# Patient Record
Sex: Male | Born: 1990 | Race: White | Hispanic: No | Marital: Single | Smoking: Current every day smoker
Health system: Southern US, Community
[De-identification: ages and names within clinical notes are randomized; demographics above are authoritative.]

## PROBLEM LIST (undated history)

## (undated) HISTORY — PX: APPENDECTOMY: SHX54

---
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
## 2010-01-27 ENCOUNTER — Emergency Department: Payer: Self-pay | Admitting: Emergency Medicine

## 2010-01-28 ENCOUNTER — Inpatient Hospital Stay: Payer: Self-pay | Admitting: Surgery

## 2010-02-02 LAB — PATHOLOGY REPORT

## 2011-06-06 IMAGING — CT CT STONE STUDY
1 of 2 series · 15 of 32 positions shown, 19 images · non-contrast
Comparison: none

REASON FOR EXAM: right flank pain radiating to RLQ, dysuria
COMMENTS:

PROCEDURE:     CT  - CT ABDOMEN /PELVIS WO (STONE)  - January 27, 2010  [DATE]
RESULT:     CT abdomen and pelvis dated 01/10/2010.
TECHNIQUE: Helical noncontrasted 3 mm sections were obtained from the lung
bases through the pubic symphysis.

[Series 2: stone · axial · 0.64mm/px · z∈[-1120,-732]mm · 15 of 141 slices shown, 19 images]
[im 6/141  soft-tissue]
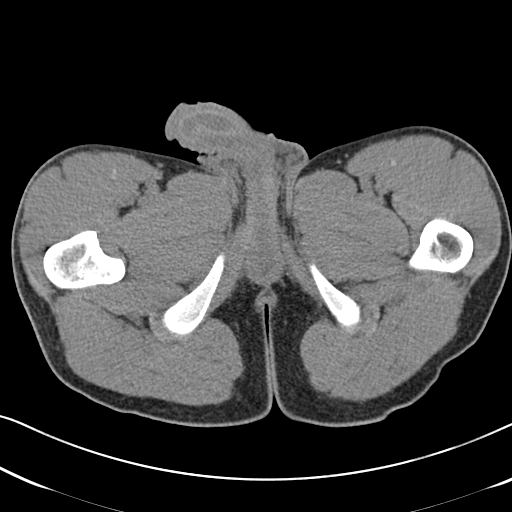
[im 6/141  bone]
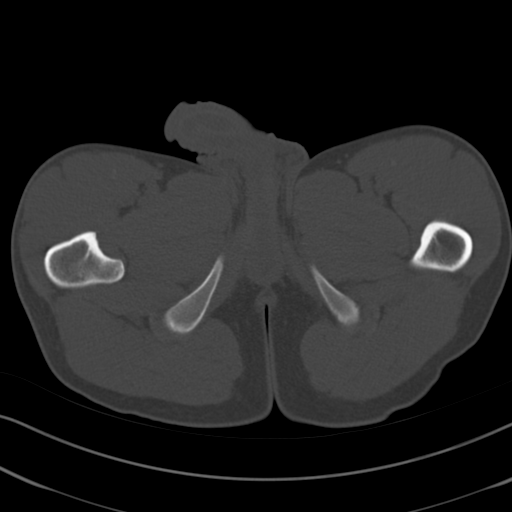
[im 18/141  soft-tissue]
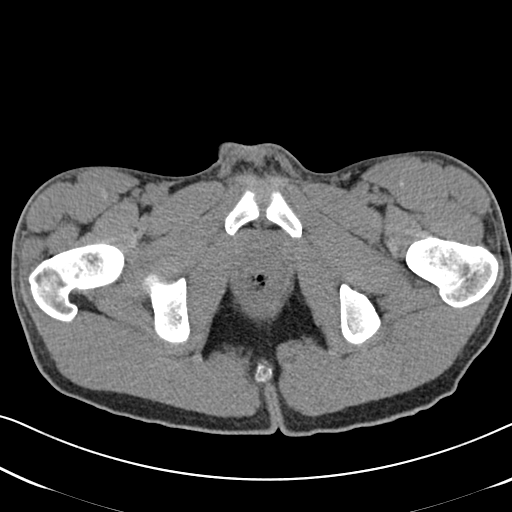
[im 30/141  soft-tissue]
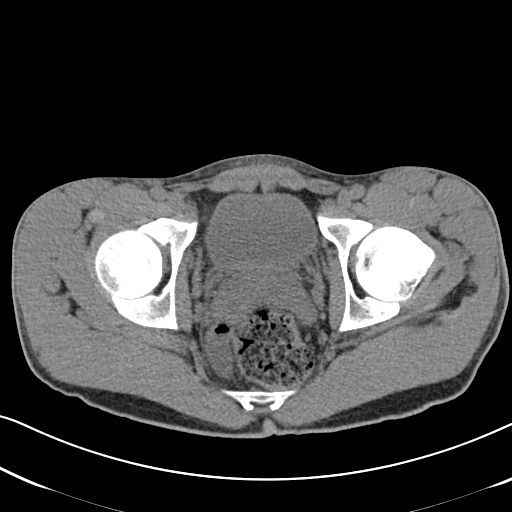
[im 41/141  soft-tissue]
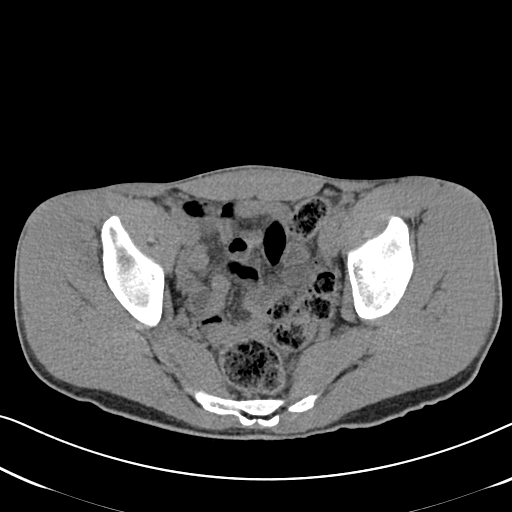
[im 47/141  soft-tissue]
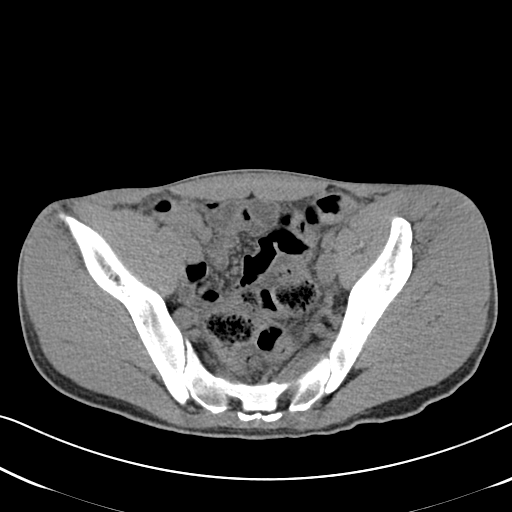
[im 59/141  soft-tissue]
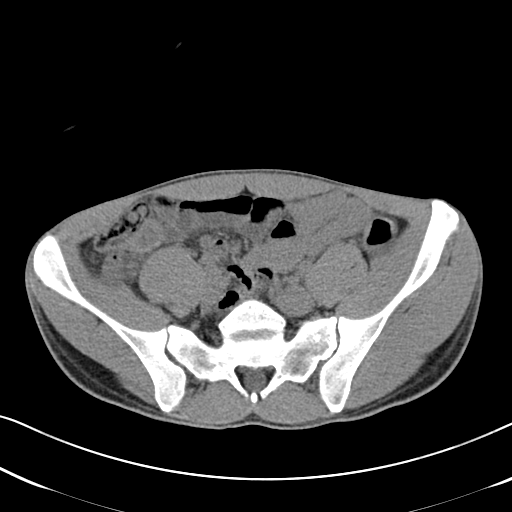
[im 71/141  soft-tissue]
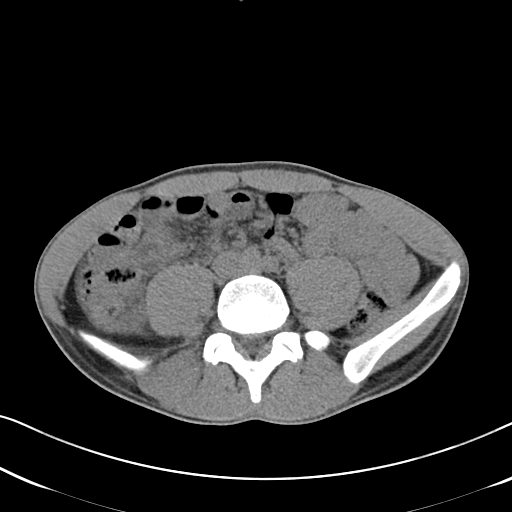
[im 82/141  soft-tissue]
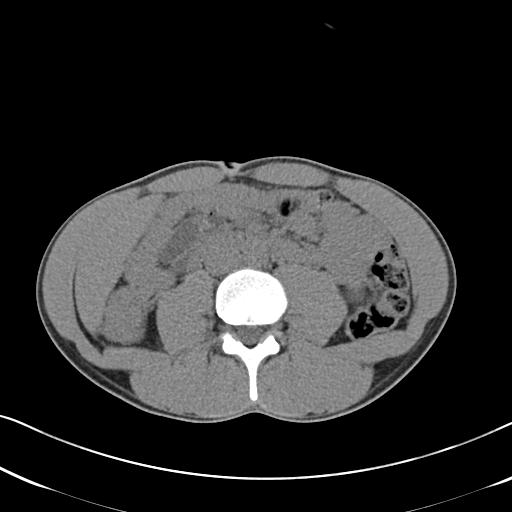
[im 94/141  soft-tissue]
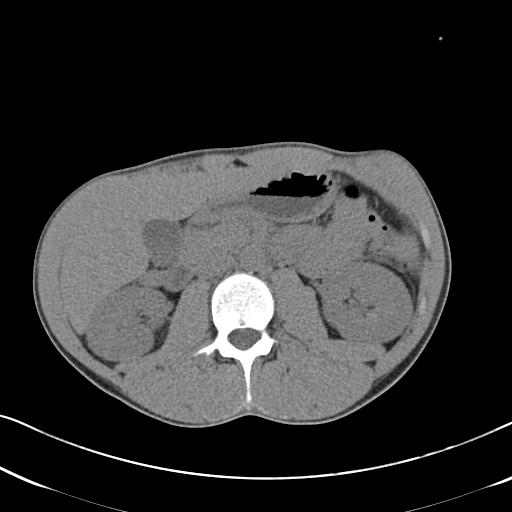
[im 94/141  bone]
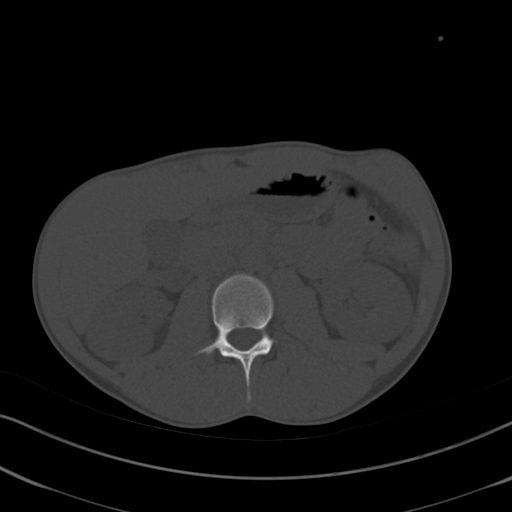
[im 100/141  soft-tissue]
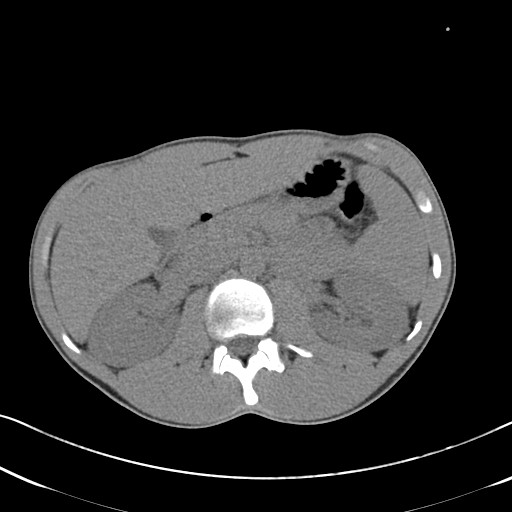
[im 111/141  soft-tissue]
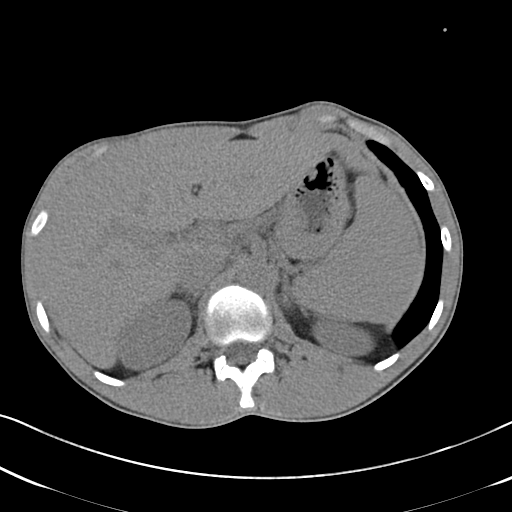
[im 117/141  lung]
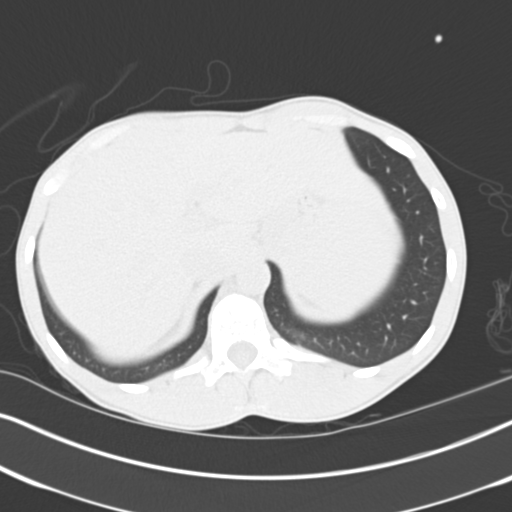
[im 123/141  soft-tissue]
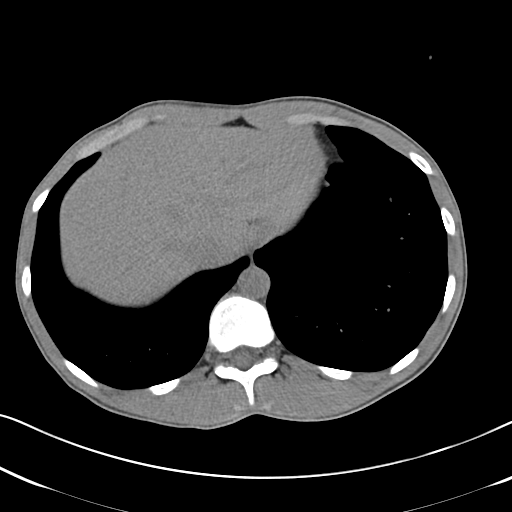
[im 123/141  lung]
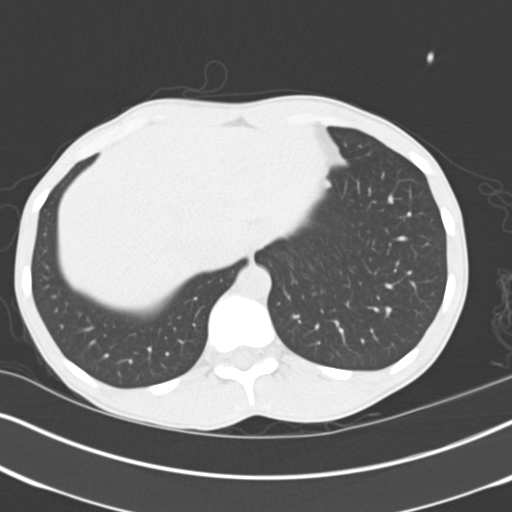
[im 129/141  lung]
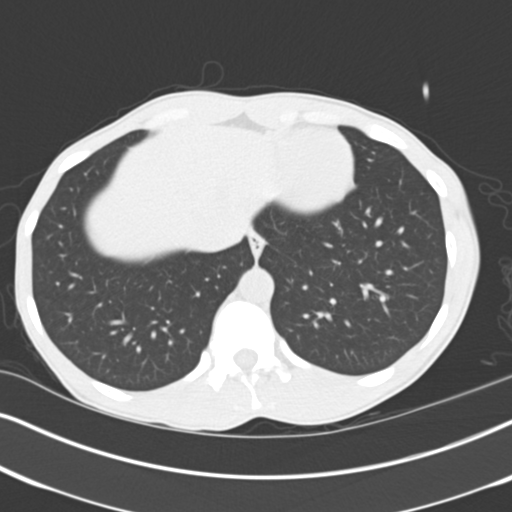
[im 135/141  soft-tissue]
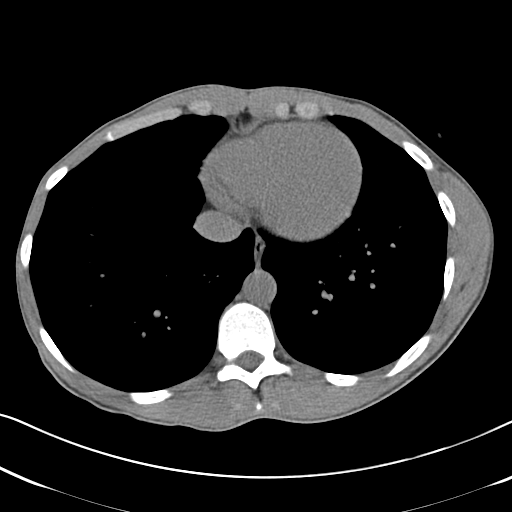
[im 135/141  lung]
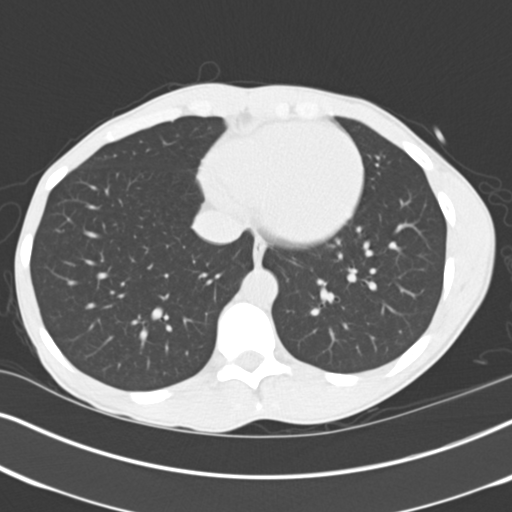

[15 of 32 positions shown; findings below may reference images not displayed]

FINDINGS: The lung bases demonstrate no gross abnormalities. Noncontrast
evaluation of the liver, spleen, adrenals, pancreas is unremarkable.

There is induration within the mesenteric fat and a mild amount of free
fluid. There is a rounded area just anterior to the iliac is muscle rupture
that is suspicious for a thickwalled appendix measuring 1.4 cm in diameter.
Clinical correlation is recommended. No drainable loculated fluid
collections are appreciated. If clinically warranted repeat evaluation with
oral and IV contrast is recommended. There is no evidence of bowel
obstruction nor evidence of abdominal aortic aneurysm.
IMPRESSION: 1. Findings concerning for appendicitis as described above clinical
correlation recommended.
2. No CT evidence of renal calculus disease.

## 2021-09-27 ENCOUNTER — Emergency Department (HOSPITAL_COMMUNITY)
Admission: EM | Admit: 2021-09-27 | Discharge: 2021-09-27 | Disposition: A | Discharge status: Home / Self Care | Payer: Self-pay | Attending: Emergency Medicine | Admitting: Emergency Medicine

## 2021-09-27 ENCOUNTER — Encounter (HOSPITAL_COMMUNITY): Payer: Self-pay | Admitting: *Deleted

## 2021-09-27 ENCOUNTER — Other Ambulatory Visit: Payer: Self-pay

## 2021-09-27 DIAGNOSIS — R001 Bradycardia, unspecified: Secondary | ICD-10-CM | POA: Insufficient documentation

## 2021-09-27 DIAGNOSIS — W57XXXA Bitten or stung by nonvenomous insect and other nonvenomous arthropods, initial encounter: Secondary | ICD-10-CM | POA: Insufficient documentation

## 2021-09-27 DIAGNOSIS — R5383 Other fatigue: Secondary | ICD-10-CM | POA: Insufficient documentation

## 2021-09-27 DIAGNOSIS — S80861A Insect bite (nonvenomous), right lower leg, initial encounter: Secondary | ICD-10-CM | POA: Insufficient documentation

## 2021-09-27 DIAGNOSIS — M791 Myalgia, unspecified site: Secondary | ICD-10-CM | POA: Insufficient documentation

## 2021-09-27 MED ORDER — ACETAMINOPHEN 325 MG PO TABS
650.0000 mg | ORAL_TABLET | Freq: Once | ORAL | Status: AC
  Administered 2021-09-27: 650 mg via ORAL
  Filled 2021-09-27: qty 2

## 2021-09-27 MED ORDER — DOXYCYCLINE HYCLATE 100 MG PO CAPS: 100.0000 mg | ORAL_CAPSULE | Freq: Two times a day (BID) | ORAL | 0 refills | Status: AC

## 2021-09-27 MED ORDER — DOXYCYCLINE HYCLATE 100 MG PO TABS
100.0000 mg | ORAL_TABLET | Freq: Once | ORAL | Status: AC
  Administered 2021-09-27: 100 mg via ORAL
  Filled 2021-09-27: qty 1

## 2021-09-27 NOTE — ED Provider Notes (Signed)
[facility] EMERGENCY DEPARTMENT Provider Note   CSN: [id number] Arrival date & time: 09/27/21  2021     History  Chief Complaint  Patient presents with   Insect Bite    Jason Shaw is a 31 y.o. male presenting today with multiple symptoms following a tick bite 2 weeks ago.  Look down in his right leg, noticed the tick lodged in his right calf.  Removed the tick, stated no part was left behind.  2 to 3 days later had a widespread rash that covered his entire body of "red dots" which disappeared entirely the next day.  2 days after that noticed a red ring with a clear center patch appear around the tick bite site.  This eventually faded.  Now complaining of fatigue, body aches, and grogginess.  Denies fevers, chills, N/V/D, abdominal pain, vision changes, headache, neck pain or stiffness.  The history is provided by the patient and medical records.       Home Medications Prior to Admission medications   Medication Sig Start Date End Date Taking? Authorizing Provider  doxycycline (VIBRAMYCIN) 100 MG capsule Take 1 capsule (100 mg total) by mouth 2 (two) times daily for 14 days. 09/27/21 10/11/21 Yes Cecil Cobbs, PA-C      Allergies    Patient has no known allergies.    Review of Systems   Review of Systems  Constitutional:  Positive for fatigue.  Skin:  Positive for wound.    Physical Exam Updated Vital Signs BP 132/75 (BP Location: Right Arm)   Pulse 70   Temp 98 F (36.7 C) (Oral)   Resp 19   Ht 5\' 10"  (1.778 m)   Wt 72.6 kg   SpO2 100%   BMI 22.96 kg/m  Physical Exam Vitals and nursing note reviewed.  Constitutional:      General: He is not in acute distress.    Appearance: He is well-developed. He is not ill-appearing, toxic-appearing or diaphoretic.  HENT:     Head: Normocephalic and atraumatic.  Eyes:     General: Lids are normal. Vision grossly intact. Gaze aligned appropriately.     Extraocular Movements: Extraocular movements intact.      Conjunctiva/sclera: Conjunctivae normal.     Pupils: Pupils are equal, round, and reactive to light.  Neck:     Comments: Very supple on exam, no meningismus Cardiovascular:     Rate and Rhythm: Regular rhythm. Bradycardia present.     Heart sounds: Normal heart sounds. No murmur heard. Pulmonary:     Effort: Pulmonary effort is normal. No respiratory distress.     Breath sounds: Normal breath sounds.  Abdominal:     Palpations: Abdomen is soft.     Tenderness: There is no abdominal tenderness.  Musculoskeletal:        General: No swelling.     Cervical back: Full passive range of motion without pain and neck supple. No rigidity or tenderness.  Lymphadenopathy:     Cervical: No cervical adenopathy.  Skin:    General: Skin is warm and dry.     Capillary Refill: Capillary refill takes less than 2 seconds.     Findings: No erythema or rash.     Comments: 0.5-1 cm healing scab of the right medial calf, without evidence of erythema migrans.  No warmth, swelling, erythema, or superimposed infection appreciated.  Nontender.  No evidence of widespread bodily rash.  Neurological:     General: No focal deficit present.  Mental Status: He is alert and oriented to person, place, and time.     GCS: GCS eye subscore is 4. GCS verbal subscore is 5. GCS motor subscore is 6.     Cranial Nerves: No dysarthria or facial asymmetry.     Sensory: No sensory deficit.     Motor: No weakness, tremor, seizure activity or pronator drift.     Coordination: Coordination normal. Finger-Nose-Finger Test and Heel to [facility] Test normal.     Gait: Gait normal.     Comments: No evidence of cranial nerve palsy or cognitive deficits.  No radiculopathy appreciated.  Psychiatric:        Mood and Affect: Mood normal.     ED Results / Procedures / Treatments   Labs (all labs ordered are listed, but only abnormal results are displayed) Labs Reviewed  ROCKY MTN SPOTTED FVR ABS PNL(IGG+IGM)  LYME DISEASE SEROLOGY  W/REFLEX    EKG EKG Interpretation  Date/Time:  Monday September 27 2021 22:28:58 EDT Ventricular Rate:  43 PR Interval:  188 QRS Duration: 98 QT Interval:  460 QTC Calculation: 388 R Axis:   81 Text Interpretation: Marked sinus bradycardia with sinus arrhythmia Abnormal ECG No previous ECGs available Confirmed by Lennice Sites ([id number]) on 09/28/2021 3:16:19 PM  Radiology No results found.  Procedures Procedures    Medications Ordered in ED Medications  doxycycline (VIBRA-TABS) tablet 100 mg (100 mg Oral Given 09/27/21 2158)  acetaminophen (TYLENOL) tablet 650 mg (650 mg Oral Given 09/27/21 2158)    ED Course/ Medical Decision Making/ A&P                           Medical Decision Making Amount and/or Complexity of Data Reviewed Labs: ordered.  Risk OTC drugs. Prescription drug management.   31 y.o. male presents to the ED for concern of Insect Bite   This involves an extensive number of treatment options, and is a complaint that carries with it a high risk of complications and morbidity.  The emergent differential diagnosis prior to evaluation includes, but is not limited to: RMSF, Lyme disease, Tick bite fever  This is not an exhaustive differential.   Past Medical History / Co-morbidities / Social History: No relevant PMHx.  Additional History:  None  Lab Tests: I ordered, and personally interpreted labs.  The pertinent results include:   Lyme serology and RMSF: Pending.  Imaging Studies: None  ED Course: Pt well-appearing on exam.  Recent tick bite about 2 weeks ago, which the pt removed himself.  Tick bite visualized, without evidence of remnants or surrounding/superimposed infection.  Describes evidence of erythema migrans a few days ago which appears to have dissipated prior to ED arrival.  Pt is well-appearing.  Nontoxic, nonseptic appearing in NAD.  Afebrile.  Sitting comfortably.  No meningismus, fevers, or neck stiffness.  Low suspicion for meningitis.  No  evidence of cognitive deficit, cranial nerve palsies, radiculopathy, or abnormal gait.  EKG without significant AV block or other dysrhythmia.  Without chest pain or shortness of breath.  No N/V/D, lymphadenopathy, or abdominal pain.  No rash or erythema migrans appreciated.  No clinical evidence of disseminated or late Lyme Disease.  Due to high risk of Lyme and RMSF, plan to treat with doxycycline.  First dose of doxycycline given in ED.  Lyme and RMSF titers pending.  Plan for discharge home on doxycycline and conservative symptom management, with close PCP follow up.  Pt satisfied with  encounter.  Patient in NAD and in good condition at time of discharge.  Disposition: After consideration the patient's encounter today, I do not feel today's workup suggests an emergent condition requiring admission or immediate intervention beyond what has been performed at this time.  Safe for discharge; instructed to return immediately for worsening symptoms, change in symptoms or any other concerns.  I have reviewed the patients home medicines and have made adjustments as needed.  Discussed course of treatment with the patient, whom demonstrated understanding.  Patient in agreement and has no further questions.    This chart was dictated using voice recognition software.  Despite best efforts to proofread, errors can occur which can change the documentation meaning.         Final Clinical Impression(s) / ED Diagnoses Final diagnoses:  Tick bite of right lower leg, initial encounter  Other fatigue    Rx / DC Orders ED Discharge Orders          Ordered    doxycycline (VIBRAMYCIN) 100 MG capsule  2 times daily        09/27/21 2259              Cecil Cobbs, Cordelia Poche 09/30/21 1953    Bethann Berkshire, MD 10/01/21 0830

## 2021-09-27 NOTE — ED Triage Notes (Signed)
Pt removed a tick about two weeks ago.  C/o HA, rash, itching at the site. Had a ring around the site the other day, no longer seen at this time. Denies fevers. + nausea.

## 2021-09-27 NOTE — Discharge Instructions (Addendum)
A prescription by the name of doxycycline has been sent to your pharmacy.  Please take 1 capsule every 12 hours for the next 2 weeks.  Do this until the course has been completed.  Always take with plenty of food and water.    Return to the ED for new or worsening symptoms as discussed.  Please follow-up with your PCP within the next 2 to 3 days for reevaluation continue medical management.  If you do not have a one see the list below.  RESOURCE GUIDE  Chronic Pain Problems: Contact [facility]  [phone] Patients need to be referred by their primary care doctor.  Insufficient Money for Medicine: Contact United Way:  call "211" or [facility] 336-053-0874.  No Primary Care Doctor: Call [facility] Connect  [phone] - can help you locate a primary care doctor that  accepts your insurance, provides certain services, etc. Physician Referral Service- [phone] Agencies that provide inexpensive medical care: [facility]  [phone] [facility]  [phone] [facility]  [phone] [facility]  [phone] Planned Parenthood  [phone] [facility]  [phone]  Medicaid-accepting [facility] Providers: [facility]- [address]  [phone], Mon-Fri 9am-7pm, Sat 9am-1pm [facility]- [address] [place], [address] [place]  [phone] [facility] [place]- [address] [place]  [phone] [facility]- [address]  [phone] Renaye Rakers- [address] [place], [address], [phone]  Only accepts [place] patients after they have their name  applied to their card  Self Pay (no insurance) in [facility]: Sickle Cell Patients: Dr Willey Blade, [facility] Internal [facility]  [address] [place], [phone] [facility] Urgent Care- [address] [place]  [phone]       [facility] [place]- [address]       -     [facility]- see information above (Speak to [employer] if you do not have insurance)       -  [facility]- [address] [place], [phone]       -  [facility]- [address],  [phone]       -  [facility]- [address], [phone]       -  Dr Julio Sicks-  [address], [place], [phone]       -  [facility]- [address] [phone]       -  [facility]- [address], [phone], also [address] [phone]       -    [facility]- [address] [place], [phone], 1st & 3rd Saturday   every month, 10am-1pm  1) Find a Doctor and Pay Out of Pocket Although you won't have to find out who is covered by your insurance plan, it is a good idea to ask around and get recommendations. You will then need to call the office and see if the doctor you have chosen will accept you as a new patient and what types of options they offer for patients who are self-pay. Some doctors offer discounts or will set up payment plans for their patients who do not have insurance, but you will need to ask so you aren't surprised when you get to  your appointment.  2) Contact Your Local Health Department Not all health departments have doctors that can see patients for sick visits, but many do, so it is worth a call to see if yours does. If you don't know where your local health department is, you can check in your phone book. The CDC also has a tool to help you locate your state's health department, and many state websites also have listings of all of their local health departments.  3) Find a Walk-in Clinic If your illness is not likely to be very severe or complicated, you may want to try a walk in clinic. These are popping up all over the country in pharmacies, drugstores, and shopping centers. They're usually staffed by nurse practitioners or physician assistants that have been trained to treat  common illnesses and complaints. They're usually fairly quick and inexpensive. However, if you have serious medical issues or chronic medical problems, these are probably not your best option  STD Testing [facility] Department of [facility] [employer], STD Clinic, [address], [place], phone [phone] or [phone].  Monday - Friday, call for an appointment. [facility] Department of [employer], STD Clinic, [place] [address], [place], phone [phone] or [phone].  Monday - Friday, call for an appointment.  Abuse/Neglect: [facility] Child Abuse Hotline (956) 595-8325 [facility] Child Abuse Hotline 5630788375 (After Hours)  Emergency Shelter:  [facility] (431)[phone]  Maternity Homes: [facility] 519-310-7158 [facility] 862-116-8000  MRSA Hotline #:   (808)573-6730  [facility]  [facility]     United Way                          [facility] Dept. [address] [place]                       [address]      [address] [place] [address]  Blondell Reveal Phone:  [phone]                                   Phone:  [phone]                 Phone:  [phone]  [facility], [phone] [facility] [place]- [phone]       -     [facility] in [place], [address]                                  [phone], [facility] Child Abuse Hotline 435-253-9984 or [phone] (After Hours)  Behavioral Health Services  Substance Abuse Resources: Alcohol and Drug Services  (540)732-6198 [facility] [phone] [place] 3053694382  [facility] 854-401-6500 Residential & Outpatient Substance Abuse Program   213-822-5788  Psychological Services: [facility]  [phone] [facility]  [phone] [facility], [phone] [place]. [address], [place], ACCESS LINE: (307) [phone] or [phone], [employer]za  Dental Assistance  Patients with Medicaid: [facility] Dental [phone]                                           [phone] [address] [employer] Phone:  [phone]                                                  Phone:  [phone]  If unable to pay or uninsured, contact:  [facility] or [facility]. to become qualified for the adult dental clinic.  Patients with Medicaid: [facility] [phone] [address], [phone] [address] [phone]  If unable to pay, or uninsured, contact [facility] [phone]) or [facility] Department [phone] in [place], [phone] in [facility]) to become qualified for the adult dental clinic  Other Low-Cost Community Dental Services: [facility]- [address], [place], [place], [address], [phone], Ext. 123, 2nd and 4th Thursday of the month at 6:30am.  10 clients each day by appointment, can sometimes see walk-in patients if someone does not show for an appointment. [facility]- [address] Ether Griffins [place], [place], [address], [phone] [facility] [address], [place], [place], [address], [phone] [facility] Health Department- [phone] [facility] Health Department- [phone] [facility] [phone]

## 2021-09-29 LAB — ROCKY MTN SPOTTED FVR ABS PNL(IGG+IGM)
RMSF IgG: NEGATIVE
RMSF IgM: 0.45 index (ref 0.00–0.89)

## 2021-09-29 LAB — LYME DISEASE SEROLOGY W/REFLEX: Lyme Total Antibody EIA: NEGATIVE
